# Patient Record
Sex: Male | Born: 2000 | Race: White | Hispanic: No | Marital: Single | Smoking: Never smoker
Health system: Southern US, Community
[De-identification: ages and names within clinical notes are randomized; demographics above are authoritative.]

## PROBLEM LIST (undated history)

## (undated) DIAGNOSIS — F419 Anxiety disorder, unspecified: Secondary | ICD-10-CM

---
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
## 2020-02-20 ENCOUNTER — Inpatient Hospital Stay (HOSPITAL_COMMUNITY)
Admission: EM | Admit: 2020-02-20 | Discharge: 2020-02-23 | DRG: 340 | Disposition: A | Discharge status: Home / Self Care | Payer: BC Managed Care – PPO | Attending: General Surgery | Admitting: General Surgery

## 2020-02-20 ENCOUNTER — Encounter (HOSPITAL_COMMUNITY): Payer: Self-pay | Admitting: Certified Registered Nurse Anesthetist

## 2020-02-20 ENCOUNTER — Other Ambulatory Visit: Payer: Self-pay

## 2020-02-20 ENCOUNTER — Encounter (HOSPITAL_COMMUNITY): Admission: EM | Disposition: A | Discharge status: Home / Self Care | Payer: Self-pay | Source: Home / Self Care

## 2020-02-20 ENCOUNTER — Emergency Department (HOSPITAL_COMMUNITY): Payer: BC Managed Care – PPO

## 2020-02-20 DIAGNOSIS — Z882 Allergy status to sulfonamides status: Secondary | ICD-10-CM

## 2020-02-20 DIAGNOSIS — Z20822 Contact with and (suspected) exposure to covid-19: Secondary | ICD-10-CM | POA: Diagnosis present

## 2020-02-20 DIAGNOSIS — K3532 Acute appendicitis with perforation and localized peritonitis, without abscess: Secondary | ICD-10-CM | POA: Diagnosis not present

## 2020-02-20 DIAGNOSIS — K3580 Unspecified acute appendicitis: Secondary | ICD-10-CM | POA: Diagnosis not present

## 2020-02-20 DIAGNOSIS — Z881 Allergy status to other antibiotic agents status: Secondary | ICD-10-CM

## 2020-02-20 HISTORY — PX: LAPAROSCOPIC APPENDECTOMY: SHX408

## 2020-02-20 LAB — COMPREHENSIVE METABOLIC PANEL
ALT: 23 U/L (ref 0–44)
AST: 30 U/L (ref 15–41)
Albumin: 4.2 g/dL (ref 3.5–5.0)
Alkaline Phosphatase: 51 U/L (ref 38–126)
Anion gap: 17 — ABNORMAL HIGH (ref 5–15)
BUN: 20 mg/dL (ref 6–20)
CO2: 22 mmol/L (ref 22–32)
Calcium: 9.4 mg/dL (ref 8.9–10.3)
Chloride: 97 mmol/L — ABNORMAL LOW (ref 98–111)
Creatinine, Ser: 1.19 mg/dL (ref 0.61–1.24)
GFR, Estimated: >60 mL/min (ref >60)
Glucose, Bld: 122 mg/dL — ABNORMAL HIGH (ref 70–99)
Potassium: 3.4 mmol/L — ABNORMAL LOW (ref 3.5–5.1)
Sodium: 136 mmol/L (ref 135–145)
Total Bilirubin: 2.3 mg/dL — ABNORMAL HIGH (ref 0.3–1.2)
Total Protein: 6.8 g/dL (ref 6.5–8.1)

## 2020-02-20 LAB — CBC
HCT: 48.9 % (ref 39.0–52.0)
Hemoglobin: 17.3 g/dL — ABNORMAL HIGH (ref 13.0–17.0)
MCH: 32.3 pg (ref 26.0–34.0)
MCHC: 35.4 g/dL (ref 30.0–36.0)
MCV: 91.4 fL (ref 80.0–100.0)
Platelets: 139 10*3/uL — ABNORMAL LOW (ref 150–400)
RBC: 5.35 MIL/uL (ref 4.22–5.81)
RDW: 11.9 % (ref 11.5–15.5)
WBC: 6.7 10*3/uL (ref 4.0–10.5)
nRBC: 0 % (ref 0.0–0.2)

## 2020-02-20 LAB — URINALYSIS, ROUTINE W REFLEX MICROSCOPIC
Bilirubin Urine: NEGATIVE
Glucose, UA: NEGATIVE mg/dL
Hgb urine dipstick: NEGATIVE
Ketones, ur: 80 mg/dL — AB
Leukocytes,Ua: NEGATIVE
Nitrite: NEGATIVE
Protein, ur: NEGATIVE mg/dL
Specific Gravity, Urine: >1.046 — ABNORMAL HIGH (ref 1.005–1.030)
pH: 7 (ref 5.0–8.0)

## 2020-02-20 LAB — SARS CORONAVIRUS 2 BY RT PCR (HOSPITAL ORDER, PERFORMED IN ~~LOC~~ HOSPITAL LAB): SARS Coronavirus 2: NEGATIVE

## 2020-02-20 LAB — LIPASE, BLOOD: Lipase: 20 U/L (ref 11–51)

## 2020-02-20 LAB — LACTIC ACID, PLASMA: Lactic Acid, Venous: 1.9 mmol/L (ref 0.5–1.9)

## 2020-02-20 SURGERY — APPENDECTOMY, LAPAROSCOPIC
Anesthesia: General | Site: Abdomen

## 2020-02-20 MED ORDER — FENTANYL CITRATE (PF) 250 MCG/5ML IJ SOLN
INTRAMUSCULAR | Status: AC
  Filled 2020-02-20: qty 5

## 2020-02-20 MED ORDER — MIDAZOLAM HCL 2 MG/2ML IJ SOLN
INTRAMUSCULAR | Status: AC
  Filled 2020-02-20: qty 2

## 2020-02-20 MED ORDER — LIDOCAINE 2% (20 MG/ML) 5 ML SYRINGE
INTRAMUSCULAR | Status: AC
  Filled 2020-02-20: qty 5

## 2020-02-20 MED ORDER — LACTATED RINGERS IV BOLUS
1000.0000 mL | Freq: Once | INTRAVENOUS | Status: AC
  Administered 2020-02-20: 1000 mL via INTRAVENOUS

## 2020-02-20 MED ORDER — IOHEXOL 300 MG/ML  SOLN
100.0000 mL | Freq: Once | INTRAMUSCULAR | Status: AC | PRN
  Administered 2020-02-20: 100 mL via INTRAVENOUS

## 2020-02-20 MED ORDER — ACETAMINOPHEN 325 MG PO TABS
650.0000 mg | ORAL_TABLET | Freq: Once | ORAL | Status: AC
  Administered 2020-02-20: 650 mg via ORAL
  Filled 2020-02-20: qty 2

## 2020-02-20 MED ORDER — PROPOFOL 10 MG/ML IV BOLUS
INTRAVENOUS | Status: AC
  Filled 2020-02-20: qty 20

## 2020-02-20 MED ORDER — KCL IN DEXTROSE-NACL 20-5-0.45 MEQ/L-%-% IV SOLN
INTRAVENOUS | Status: DC
  Filled 2020-02-20 (×4): qty 1000

## 2020-02-20 MED ORDER — MORPHINE SULFATE (PF) 4 MG/ML IV SOLN
4.0000 mg | Freq: Once | INTRAVENOUS | Status: AC
  Administered 2020-02-20: 4 mg via INTRAVENOUS
  Filled 2020-02-20: qty 1

## 2020-02-20 MED ORDER — SODIUM CHLORIDE 0.9 % IV SOLN
2.0000 g | INTRAVENOUS | Status: DC
  Administered 2020-02-20: 2 g via INTRAVENOUS
  Filled 2020-02-20: qty 20

## 2020-02-20 MED ORDER — METRONIDAZOLE IN NACL 5-0.79 MG/ML-% IV SOLN
500.0000 mg | Freq: Three times a day (TID) | INTRAVENOUS | Status: DC
  Administered 2020-02-20: 500 mg via INTRAVENOUS
  Filled 2020-02-20: qty 100

## 2020-02-20 SURGICAL SUPPLY — 70 items
ADH SKN CLS APL DERMABOND .7 (GAUZE/BANDAGES/DRESSINGS) ×1
APL PRP STRL LF DISP 70% ISPRP (MISCELLANEOUS) ×1
APL SWBSTK 6 STRL LF DISP (MISCELLANEOUS) ×1
APPLICATOR COTTON TIP 6 STRL (MISCELLANEOUS) IMPLANT
APPLICATOR COTTON TIP 6IN STRL (MISCELLANEOUS) ×2
APPLIER CLIP ROT 10 11.4 M/L (STAPLE)
APR CLP MED LRG 11.4X10 (STAPLE)
BAG SPEC RTRVL 10 TROC 200 (ENDOMECHANICALS) ×1
BLADE CLIPPER SURG (BLADE) IMPLANT
CANISTER SUCT 3000ML PPV (MISCELLANEOUS) ×3 IMPLANT
CHLORAPREP W/TINT 26 (MISCELLANEOUS) ×2 IMPLANT
CLIP APPLIE ROT 10 11.4 M/L (STAPLE) IMPLANT
CONT SPEC 4OZ CLIKSEAL STRL BL (MISCELLANEOUS) ×1 IMPLANT
COVER SURGICAL LIGHT HANDLE (MISCELLANEOUS) ×2 IMPLANT
COVER WAND RF STERILE (DRAPES) ×1 IMPLANT
CUTTER FLEX LINEAR 45M (STAPLE) ×2 IMPLANT
DERMABOND ADVANCED (GAUZE/BANDAGES/DRESSINGS) ×1
DERMABOND ADVANCED .7 DNX12 (GAUZE/BANDAGES/DRESSINGS) IMPLANT
DRSG TEGADERM 4X4.75 (GAUZE/BANDAGES/DRESSINGS) IMPLANT
ELECT REM PT RETURN 9FT ADLT (ELECTROSURGICAL) ×2
ELECTRODE REM PT RTRN 9FT ADLT (ELECTROSURGICAL) ×1 IMPLANT
ENDOLOOP SUT PDS II  0 18 (SUTURE)
ENDOLOOP SUT PDS II 0 18 (SUTURE) IMPLANT
GLOVE BIOGEL M STRL SZ7.5 (GLOVE) ×2 IMPLANT
GLOVE BIOGEL PI ORTHO PRO 7.5 (GLOVE) ×1
GLOVE BIOGEL PI ORTHO PRO SZ7 (GLOVE) ×1
GLOVE INDICATOR 8.0 STRL GRN (GLOVE) ×4 IMPLANT
GLOVE PI ORTHO PRO STRL 7.5 (GLOVE) IMPLANT
GLOVE PI ORTHO PRO STRL SZ7 (GLOVE) IMPLANT
GLOVE SRG 8 PF TXTR STRL LF DI (GLOVE) IMPLANT
GLOVE SURG SS PI 7.0 STRL IVOR (GLOVE) IMPLANT
GLOVE SURG UNDER POLY LF SZ7 (GLOVE) ×1 IMPLANT
GLOVE SURG UNDER POLY LF SZ8 (GLOVE) ×2
GOWN STRL REUS W/ TWL LRG LVL3 (GOWN DISPOSABLE) ×2 IMPLANT
GOWN STRL REUS W/TWL 2XL LVL3 (GOWN DISPOSABLE) ×2 IMPLANT
GOWN STRL REUS W/TWL LRG LVL3 (GOWN DISPOSABLE) ×2
GRASPER SUT TROCAR 14GX15 (MISCELLANEOUS) IMPLANT
KIT BASIN OR (CUSTOM PROCEDURE TRAY) ×2 IMPLANT
KIT TURNOVER KIT B (KITS) ×2 IMPLANT
NDL 18GX1X1/2 (RX/OR ONLY) (NEEDLE) IMPLANT
NEEDLE 18GX1X1/2 (RX/OR ONLY) (NEEDLE) ×2 IMPLANT
NS IRRIG 1000ML POUR BTL (IV SOLUTION) ×2 IMPLANT
PAD ARMBOARD 7.5X6 YLW CONV (MISCELLANEOUS) ×4 IMPLANT
POUCH RETRIEVAL ECOSAC 10 (ENDOMECHANICALS) IMPLANT
POUCH RETRIEVAL ECOSAC 10MM (ENDOMECHANICALS) ×2
RELOAD 45 VASCULAR/THIN (ENDOMECHANICALS) IMPLANT
RELOAD STAPLE 45 2.5 WHT GRN (ENDOMECHANICALS) IMPLANT
RELOAD STAPLE 45 3.5 BLU ETS (ENDOMECHANICALS) IMPLANT
RELOAD STAPLE 60 3.6 BLU REG (STAPLE) IMPLANT
RELOAD STAPLE TA45 3.5 REG BLU (ENDOMECHANICALS) IMPLANT
RELOAD STAPLER BLUE 60MM (STAPLE) ×1 IMPLANT
SCISSORS LAP 5X35 DISP (ENDOMECHANICALS) IMPLANT
SET IRRIG TUBING LAPAROSCOPIC (IRRIGATION / IRRIGATOR) ×2 IMPLANT
SET TUBE SMOKE EVAC HIGH FLOW (TUBING) ×2 IMPLANT
SHEARS HARMONIC ACE PLUS 36CM (ENDOMECHANICALS) ×2 IMPLANT
SLEEVE ENDOPATH XCEL 5M (ENDOMECHANICALS) ×2 IMPLANT
SPONGE GAUZE 2X2 8PLY STRL LF (GAUZE/BANDAGES/DRESSINGS) IMPLANT
STAPLE ECHEON FLEX 60 POW ENDO (STAPLE) ×1 IMPLANT
STAPLER RELOAD BLUE 60MM (STAPLE) ×2
STRIP CLOSURE SKIN 1/2X4 (GAUZE/BANDAGES/DRESSINGS) IMPLANT
SUT MNCRL AB 4-0 PS2 18 (SUTURE) ×2 IMPLANT
SUT VICRYL 0 UR6 27IN ABS (SUTURE) IMPLANT
TOWEL GREEN STERILE (TOWEL DISPOSABLE) ×2 IMPLANT
TOWEL GREEN STERILE FF (TOWEL DISPOSABLE) ×2 IMPLANT
TRAY FOLEY W/BAG SLVR 16FR (SET/KITS/TRAYS/PACK)
TRAY FOLEY W/BAG SLVR 16FR ST (SET/KITS/TRAYS/PACK) IMPLANT
TRAY LAPAROSCOPIC MC (CUSTOM PROCEDURE TRAY) ×2 IMPLANT
TROCAR XCEL BLUNT TIP 100MML (ENDOMECHANICALS) ×2 IMPLANT
TROCAR XCEL NON-BLD 5MMX100MML (ENDOMECHANICALS) ×2 IMPLANT
WATER STERILE IRR 1000ML POUR (IV SOLUTION) ×2 IMPLANT

## 2020-02-20 NOTE — ED Notes (Signed)
Pt ambulatory to [facility]

## 2020-02-20 NOTE — ED Triage Notes (Signed)
Pt from home for eval of intense pain in lower abdomen and groin, beginning below his umbilicus and radiating towards scrotum. Has been vomiting almost every hour since Tuesday, but pain didn't start until today about two hours PTA.

## 2020-02-20 NOTE — H&P (Signed)
CC: abdominal pain  Requesting provider: Dr Silverio Lay  HPI: Clinton Woods is an 20 y.o. male who is here for evaluation because of nausea, vomiting, and abdominal pain.  He also had fever as well.  He started having nausea and vomiting on Tuesday in the mid day.  He states that he vomited pretty much on the hour every hour from Tuesday to Wednesday.  He then developed abdominal discomfort.  This was mainly around his umbilicus and is now shifted to his right abdomen.  He also had fever.  He also did not have much of an appetite.  He had a protein bar and a banana this morning.  Last bowel movement was today.  No prior surgery.  No prior symptoms.  No trouble urinating.  No blood in stool.  No hematemesis.  Goes to [facility]  No tobacco or drug use or alcohol use  Past Medical History:  Diagnosis Date  . Anxiety     No past surgical history on file.  No family history on file.  Social:  reports that he has never smoked. He has never used smokeless tobacco. He reports that he does not drink alcohol and does not use drugs.  Allergies:  Allergies  Allergen Reactions  . Doxycycline Hyclate Other (See Comments)    abdominal pain  . Sulfamethoxazole-Trimethoprim Rash    Medications: I have reviewed the patient's current medications.   ROS - all of the below systems have been reviewed with the patient and positives are indicated with bold text General: chills, fever or night sweats Eyes: blurry vision or double vision ENT: epistaxis or sore throat Allergy/Immunology: itchy/watery eyes or nasal congestion Hematologic/Lymphatic: bleeding problems, blood clots or swollen lymph nodes Endocrine: temperature intolerance or unexpected weight changes Breast: new or changing breast lumps or nipple discharge Resp: cough, shortness of breath, or wheezing CV: chest pain or dyspnea on exertion GI: as per HPI GU: dysuria, trouble voiding, or hematuria MSK: joint pain or joint stiffness Neuro: TIA  or stroke symptoms Derm: pruritus and skin lesion changes Psych: anxiety and depression  PE Blood pressure (!) 121/51, pulse 91, temperature (!) 102.5 F (39.2 C), temperature source Oral, resp. rate 18, SpO2 99 %. Constitutional: NAD; conversant; no deformities Eyes: Moist conjunctiva; no lid lag; anicteric; PERRL Neck: Trachea midline; no thyromegaly Lungs: Normal respiratory effort; no tactile fremitus CV: RRR; no palpable thrills; no pitting edema GI: Abd flat, soft, TTP right abd, +voluntary guarding; no palpable hepatosplenomegaly MSK: Normal gait; no clubbing/cyanosis Psychiatric: Appropriate affect; alert and oriented x3 Lymphatic: No palpable cervical or axillary lymphadenopathy Skin:no rash/lesion/edema  Results for orders placed or performed during the hospital encounter of 02/20/20 (from the past 48 hour(s))  Lipase, blood     Status: None   Collection Time: 02/20/20  5:19 PM  Result Value Ref Range   Lipase 20 11 - 51 U/L    Comment: Performed at [facility], [address] [place], [place] [address]  Comprehensive metabolic panel     Status: Abnormal   Collection Time: 02/20/20  5:19 PM  Result Value Ref Range   Sodium 136 135 - 145 mmol/L   Potassium 3.4 (L) 3.5 - 5.1 mmol/L   Chloride 97 (L) 98 - 111 mmol/L   CO2 22 22 - 32 mmol/L   Glucose, Bld 122 (H) 70 - 99 mg/dL    Comment: Glucose reference range applies only to samples taken after fasting for at least 8 hours.  BUN 20 6 - 20 mg/dL   Creatinine, Ser 4.88 0.61 - 1.24 mg/dL   Calcium 9.4 8.9 - 89.1 mg/dL   Total Protein 6.8 6.5 - 8.1 g/dL   Albumin 4.2 3.5 - 5.0 g/dL   AST 30 15 - 41 U/L   ALT 23 0 - 44 U/L   Alkaline Phosphatase 51 38 - 126 U/L   Total Bilirubin 2.3 (H) 0.3 - 1.2 mg/dL   GFR, Estimated >69 >45 mL/min    Comment: (NOTE) Calculated using the CKD-EPI Creatinine Equation (2021)    Anion gap 17 (H) 5 - 15    Comment: Performed at [facility], [address],  [place], [place] [address]  CBC     Status: Abnormal   Collection Time: 02/20/20  5:19 PM  Result Value Ref Range   WBC 6.7 4.0 - 10.5 K/uL   RBC 5.35 4.22 - 5.81 MIL/uL   Hemoglobin 17.3 (H) 13.0 - 17.0 g/dL   HCT 28.0 03.4 - 91.7 %   MCV 91.4 80.0 - 100.0 fL   MCH 32.3 26.0 - 34.0 pg   MCHC 35.4 30.0 - 36.0 g/dL   RDW 91.5 05.6 - 97.9 %   Platelets 139 (L) 150 - 400 K/uL    Comment: REPEATED TO VERIFY   nRBC 0.0 0.0 - 0.2 %    Comment: Performed at [facility], [address] [place], [place] [address]  SARS Coronavirus 2 by RT PCR (hospital order, performed in [facility] hospital lab) Nasopharyngeal Nasopharyngeal Swab     Status: None   Collection Time: 02/20/20  7:36 PM   Specimen: Nasopharyngeal Swab  Result Value Ref Range   SARS Coronavirus 2 NEGATIVE NEGATIVE    Comment: (NOTE) SARS-CoV-2 target nucleic acids are NOT DETECTED.  The SARS-CoV-2 RNA is generally detectable in upper and lower respiratory specimens during the acute phase of infection. The lowest concentration of SARS-CoV-2 viral copies this assay can detect is 250 copies / mL. A negative result does not preclude SARS-CoV-2 infection and should not be used as the sole basis for treatment or other patient management decisions.  A negative result may occur with improper specimen collection / handling, submission of specimen other than nasopharyngeal swab, presence of viral mutation(s) within the areas targeted by this assay, and inadequate number of viral copies (<250 copies / mL). A negative result must be combined with clinical observations, patient history, and epidemiological information.  Fact Sheet for Patients:   BoilerBrush.com.cy  Fact Sheet for Healthcare Providers: https://pope.com/  This test is not yet approved or  cleared by the [place] FDA and has been authorized for detection and/or diagnosis of SARS-CoV-2 by FDA under an Emergency  Use Authorization (EUA).  This EUA will remain in effect (meaning this test can be used) for the duration of the COVID-19 declaration under Section 564(b)(1) of the Act, 21 U.S.C. section 360bbb-3(b)(1), unless the authorization is terminated or revoked sooner.  Performed at [facility], [address], [place], [place] [address]   Urinalysis, Routine w reflex microscopic Urine, Clean Catch     Status: Abnormal   Collection Time: 02/20/20  9:17 PM  Result Value Ref Range   Color, Urine YELLOW YELLOW   APPearance CLEAR CLEAR   Specific Gravity, Urine >1.046 (H) 1.005 - 1.030   pH 7.0 5.0 - 8.0   Glucose, UA NEGATIVE NEGATIVE mg/dL   Hgb urine dipstick NEGATIVE NEGATIVE   Bilirubin Urine NEGATIVE  NEGATIVE   Ketones, ur 80 (A) NEGATIVE mg/dL   Protein, ur NEGATIVE NEGATIVE mg/dL   Nitrite NEGATIVE NEGATIVE   Leukocytes,Ua NEGATIVE NEGATIVE    Comment: Performed at [facility], [address], [place], [place] [address]  Lactic acid, plasma     Status: None   Collection Time: 02/20/20  9:23 PM  Result Value Ref Range   Lactic Acid, Venous 1.9 0.5 - 1.9 mmol/L    Comment: Performed at [facility], [address], [place], [place] [address]    CT ABDOMEN PELVIS W CONTRAST  Result Date: 02/20/2020 CLINICAL DATA:  Abdominal pain for 2 days, fever EXAM: CT ABDOMEN AND PELVIS WITH CONTRAST TECHNIQUE: Multidetector CT imaging of the abdomen and pelvis was performed using the standard protocol following bolus administration of intravenous contrast. CONTRAST:  OMNIPAQUE IOHEXOL 300 MG/ML  SOLN COMPARISON:  None. FINDINGS: Lower chest: No acute pleural or parenchymal lung disease. Hepatobiliary: No focal liver abnormality is seen. No gallstones, gallbladder wall thickening, or biliary dilatation. Pancreas: Unremarkable. No pancreatic ductal dilatation or surrounding inflammatory changes. Spleen: Normal in size without focal abnormality. Adrenals/Urinary Tract: Adrenal  glands are unremarkable. Kidneys are normal, without renal calculi, focal lesion, or hydronephrosis. Bladder is unremarkable. Stomach/Bowel: Evaluation of the bowel is limited without oral contrast. No bowel obstruction or ileus. I believe the appendix can be seen in the right lower quadrant adjacent to the iliac vessels, with evidence of distal wall thickening, hyperenhancement, and dilation measuring 11 mm reference image 72/3. No free gas to suggest perforation. No evidence of abscess. Vascular/Lymphatic: No significant vascular findings are present. No enlarged abdominal or pelvic lymph nodes. Reproductive: Prostate is unremarkable. Other: Small amount of free fluid within the rectovesical space. No free intraperitoneal gas. No abdominal wall hernia. Musculoskeletal: No acute or destructive bony lesions. Reconstructed images demonstrate no additional findings. IMPRESSION: 1. Dilated inflamed appendix in the right lower quadrant, compatible with acute uncomplicated appendicitis. There is free fluid in the pelvis, but no evidence of perforation, loculated fluid, or abscess. 2. No other intra-abdominal or intrapelvic findings on this examination limited by the lack of oral contrast. Electronically Signed   By: Sharlet Salina M.D.   On: 02/20/2020 20:27    Imaging: Personally reviewed  A/P: HAMZA EMPSON is an 20 y.o. male with  Acute appendicitis N/v/abd pain/fever (due to above) Hypokalemia likely secondary to emesis Elevated T bili Elevated Hct  Likely some mild dehydration from vomiting causing hypokalemia and elevated tbili  We discussed the etiology and management of acute appendicitis. We discussed operative and nonoperative management.  I recommended operative management along with IV antibiotics.  We discussed laparoscopic appendectomy. We discussed the risk and benefits of surgery including but not limited to bleeding, infection, injury to surrounding structures, need to convert to an  open procedure, blood clot formation, post operative abscess or wound infection, staple line complications such as leak or bleeding, hernia formation, post operative ileus, need for additional procedures, anesthesia complications, and the typical postoperative course. I explained that the patient should expect a good improvement in their symptoms. His mom was at bedside.   IV abx IVF Replace potassium Repeat labs in am to follow Wendie Agreste. Andrey Campanile, MD, FACS General, Bariatric, & Minimally Invasive Surgery [facility], [place]

## 2020-02-20 NOTE — ED Provider Notes (Signed)
[facility] EMERGENCY DEPARTMENT Provider Note   CSN: [id number] Arrival date & time: 02/20/20  1656     History Chief Complaint  Patient presents with  . Abdominal Pain    Clinton Woods is a 20 y.o. male.  The history is provided by the patient.  Abdominal Pain Pain location:  Periumbilical Pain quality: aching and cramping   Pain radiates to:  Suprapubic region and epigastric region Pain severity:  Severe Onset quality:  Gradual Timing:  Constant Progression:  Unchanged Chronicity:  New Context: diet changes   Context: not suspicious food intake   Relieved by:  Nothing Worsened by:  Movement Ineffective treatments:  None tried Associated symptoms: anorexia and nausea   Associated symptoms: no chest pain, no chills, no cough, no dysuria, no fever, no hematuria, no shortness of breath, no sore throat and no vomiting        Past Medical History:  Diagnosis Date  . Anxiety     Patient Active Problem List   Diagnosis Date Noted  . 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 It band syndrome, right 10/19/2017  . 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     No family history on file.  Social History   Tobacco Use  . Smoking status: Never Smoker  . Smokeless tobacco: Never Used  Vaping Use  . Vaping Use: Never used  Substance Use Topics  . Alcohol use: No  . Drug use: No    Home Medications Prior to Admission medications   Medication Sig Start Date End Date Taking? Authorizing Provider  ARIPiprazole (ABILIFY) 10 MG tablet Take 1 tablet (10 mg total) by mouth at bedtime. Patient not taking: No sig reported 02/23/17   [name], MD  lamoTRIgine (LAMICTAL) 100 MG tablet Take 1 tablet (100 mg total) by mouth daily. Patient not taking: No sig reported 02/24/17   [name], MD    Allergies    Doxycycline hyclate and  Sulfamethoxazole-trimethoprim  Review of Systems   Review of Systems  Unable to perform ROS: Acuity of condition  Constitutional: Negative for chills and fever.  HENT: Negative for ear pain and sore throat.   Eyes: Negative for pain and visual disturbance.  Respiratory: Negative for cough and shortness of breath.   Cardiovascular: Negative for chest pain and palpitations.  Gastrointestinal: Positive for abdominal pain, anorexia and nausea. Negative for vomiting.  Genitourinary: Negative for dysuria and hematuria.  Musculoskeletal: Negative for arthralgias and back pain.  Skin: Negative for color change and rash.  Neurological: Negative for seizures and syncope.  All other systems reviewed and are negative.   Physical Exam Updated Vital Signs BP 127/62   Pulse 95   Temp (!) 102.9 F (39.4 C) (Oral)   Resp (!) 21   SpO2 100%   Physical Exam Vitals and nursing note reviewed.  Constitutional:      Appearance: He is well-developed and well-nourished.  HENT:     Head: Normocephalic and atraumatic.  Eyes:     Conjunctiva/sclera: Conjunctivae normal.  Cardiovascular:     Rate and Rhythm: Normal rate and regular rhythm.     Heart sounds: No murmur heard.   Pulmonary:     Effort: Pulmonary effort is normal. No respiratory distress.     Breath sounds: Normal breath sounds.  Abdominal:     Palpations: Abdomen is soft.     Tenderness: There is abdominal tenderness in  the periumbilical area and suprapubic area. There is guarding. Positive signs include Rovsing's sign.  Musculoskeletal:        General: No edema.     Cervical back: Neck supple.  Skin:    General: Skin is warm and dry.  Neurological:     Mental Status: He is alert.  Psychiatric:        Mood and Affect: Mood and affect normal.     ED Results / Procedures / Treatments   Labs (all labs ordered are listed, but only abnormal results are displayed) Labs Reviewed  COMPREHENSIVE METABOLIC PANEL - Abnormal;  Notable for the following components:      Result Value   Potassium 3.4 (*)    Chloride 97 (*)    Glucose, Bld 122 (*)    Total Bilirubin 2.3 (*)    Anion gap 17 (*)    All other components within normal limits  CBC - Abnormal; Notable for the following components:   Hemoglobin 17.3 (*)    Platelets 139 (*)    All other components within normal limits  SARS CORONAVIRUS 2 BY RT PCR (HOSPITAL ORDER, PERFORMED IN [facility])  LIPASE, BLOOD  URINALYSIS, ROUTINE W REFLEX MICROSCOPIC  LACTIC ACID, PLASMA  LACTIC ACID, PLASMA    EKG None  Radiology CT ABDOMEN PELVIS W CONTRAST  Result Date: 02/20/2020 CLINICAL DATA:  Abdominal pain for 2 days, fever EXAM: CT ABDOMEN AND PELVIS WITH CONTRAST TECHNIQUE: Multidetector CT imaging of the abdomen and pelvis was performed using the standard protocol following bolus administration of intravenous contrast. CONTRAST:  OMNIPAQUE IOHEXOL 300 MG/ML  SOLN COMPARISON:  None. FINDINGS: Lower chest: No acute pleural or parenchymal lung disease. Hepatobiliary: No focal liver abnormality is seen. No gallstones, gallbladder wall thickening, or biliary dilatation. Pancreas: Unremarkable. No pancreatic ductal dilatation or surrounding inflammatory changes. Spleen: Normal in size without focal abnormality. Adrenals/Urinary Tract: Adrenal glands are unremarkable. Kidneys are normal, without renal calculi, focal lesion, or hydronephrosis. Bladder is unremarkable. Stomach/Bowel: Evaluation of the bowel is limited without oral contrast. No bowel obstruction or ileus. I believe the appendix can be seen in the right lower quadrant adjacent to the iliac vessels, with evidence of distal wall thickening, hyperenhancement, and dilation measuring 11 mm reference image 72/3. No free gas to suggest perforation. No evidence of abscess. Vascular/Lymphatic: No significant vascular findings are present. No enlarged abdominal or pelvic lymph nodes. Reproductive:  Prostate is unremarkable. Other: Small amount of free fluid within the rectovesical space. No free intraperitoneal gas. No abdominal wall hernia. Musculoskeletal: No acute or destructive bony lesions. Reconstructed images demonstrate no additional findings. IMPRESSION: 1. Dilated inflamed appendix in the right lower quadrant, compatible with acute uncomplicated appendicitis. There is free fluid in the pelvis, but no evidence of perforation, loculated fluid, or abscess. 2. No other intra-abdominal or intrapelvic findings on this examination limited by the lack of oral contrast. Electronically Signed   By: Sharlet Salina M.D.   On: 02/20/2020 20:27    Procedures Procedures   Medications Ordered in ED Medications  cefTRIAXone (ROCEPHIN) 2 g in sodium chloride 0.9 % 100 mL IVPB (2 g Intravenous New Bag/Given 02/20/20 2123)  metroNIDAZOLE (FLAGYL) IVPB 500 mg (500 mg Intravenous New Bag/Given 02/20/20 2122)  lactated ringers bolus 1,000 mL (0 mLs Intravenous Stopped 02/20/20 2103)  morphine 4 MG/ML injection 4 mg (4 mg Intravenous Given 02/20/20 1928)  iohexol (OMNIPAQUE) 300 MG/ML solution 100 mL (100 mLs Intravenous Contrast Given 02/20/20 2020)  ED Course  I have reviewed the triage vital signs and the nursing notes.  Pertinent labs & imaging results that were available during my care of the patient were reviewed by me and considered in my medical decision making (see chart for details).    MDM Rules/Calculators/A&P                          This is a 20 year old male with no reported past medical history presents emergency department for lower abdominal pain that started around 1530 today.  Patient reports that on Tuesday night he had brief episodes of vomiting that resolved on Wednesday morning he felt that he was doing well until today around 1530 he developed significant lower abdominal pain that was around the umbilicus.  He also endorses some anorexia with decreased appetite and some nausea  but no vomiting today.  He states the pain starts around his bellybutton and radiates into his groin as well as up into the epigastric area.  On exam he is febrile to 102.9, hemodynamically stable, in mild pain distress.  He has significant abdominal tenderness, mostly over the umbilicus with associated guarding.  Testicular exam is normal with no testicular tenderness or swelling.  Patient does have anion gap and normal bicarb, hemoglobin of 17.3 which is likely hemoconcentration as the patient reports he has not been able to eat or drink today because he has not had any appetite.  Concern for appendicitis, CT was obtained that showed dilated inflamed appendix in the right lower quadrant compatible with acute uncomplicated appendicitis with free fluid in the pelvis but no evidence of perforation, loculated fluid, or abscess.  I did speak with Dr. Andrey Campanile, general surgery, who will plan to admit to evaluate the patient and discuss surgical intervention.  Patient was given morphine in the emergency department with significant improvement in his pain. Final Clinical Impression(s) / ED Diagnoses Final diagnoses:  Acute appendicitis, unspecified acute appendicitis type    Rx / DC Orders ED Discharge Orders    None       Kathleen Lime, MD 02/20/20 2126    Charlynne Pander, MD 02/24/20 1030

## 2020-02-21 ENCOUNTER — Emergency Department (HOSPITAL_COMMUNITY): Payer: BC Managed Care – PPO | Admitting: Certified Registered Nurse Anesthetist

## 2020-02-21 ENCOUNTER — Encounter (HOSPITAL_COMMUNITY): Payer: Self-pay | Admitting: General Surgery

## 2020-02-21 DIAGNOSIS — K3580 Unspecified acute appendicitis: Secondary | ICD-10-CM | POA: Diagnosis present

## 2020-02-21 DIAGNOSIS — K3532 Acute appendicitis with perforation and localized peritonitis, without abscess: Secondary | ICD-10-CM | POA: Diagnosis present

## 2020-02-21 DIAGNOSIS — Z20822 Contact with and (suspected) exposure to covid-19: Secondary | ICD-10-CM | POA: Diagnosis present

## 2020-02-21 DIAGNOSIS — Z882 Allergy status to sulfonamides status: Secondary | ICD-10-CM | POA: Diagnosis not present

## 2020-02-21 DIAGNOSIS — Z881 Allergy status to other antibiotic agents status: Secondary | ICD-10-CM | POA: Diagnosis not present

## 2020-02-21 LAB — CBC
HCT: 42.6 % (ref 39.0–52.0)
Hemoglobin: 14.8 g/dL (ref 13.0–17.0)
MCH: 32.3 pg (ref 26.0–34.0)
MCHC: 34.7 g/dL (ref 30.0–36.0)
MCV: 93 fL (ref 80.0–100.0)
Platelets: 100 10*3/uL — ABNORMAL LOW (ref 150–400)
RBC: 4.58 MIL/uL (ref 4.22–5.81)
RDW: 11.9 % (ref 11.5–15.5)
WBC: 8.4 10*3/uL (ref 4.0–10.5)
nRBC: 0 % (ref 0.0–0.2)

## 2020-02-21 LAB — COMPREHENSIVE METABOLIC PANEL
ALT: 17 U/L (ref 0–44)
AST: 20 U/L (ref 15–41)
Albumin: 3.1 g/dL — ABNORMAL LOW (ref 3.5–5.0)
Alkaline Phosphatase: 42 U/L (ref 38–126)
Anion gap: 9 (ref 5–15)
BUN: 20 mg/dL (ref 6–20)
CO2: 26 mmol/L (ref 22–32)
Calcium: 8.5 mg/dL — ABNORMAL LOW (ref 8.9–10.3)
Chloride: 100 mmol/L (ref 98–111)
Creatinine, Ser: 1.12 mg/dL (ref 0.61–1.24)
GFR, Estimated: >60 mL/min (ref >60)
Glucose, Bld: 158 mg/dL — ABNORMAL HIGH (ref 70–99)
Potassium: 4.6 mmol/L (ref 3.5–5.1)
Sodium: 135 mmol/L (ref 135–145)
Total Bilirubin: 1.7 mg/dL — ABNORMAL HIGH (ref 0.3–1.2)
Total Protein: 5.4 g/dL — ABNORMAL LOW (ref 6.5–8.1)

## 2020-02-21 LAB — MAGNESIUM: Magnesium: 1.8 mg/dL (ref 1.7–2.4)

## 2020-02-21 LAB — HIV ANTIBODY (ROUTINE TESTING W REFLEX): HIV Screen 4th Generation wRfx: NONREACTIVE

## 2020-02-21 MED ORDER — SODIUM CHLORIDE 0.9 % IR SOLN
Status: DC | PRN
  Administered 2020-02-20: 1000 mL

## 2020-02-21 MED ORDER — KETOROLAC TROMETHAMINE 30 MG/ML IJ SOLN
INTRAMUSCULAR | Status: DC | PRN
  Administered 2020-02-21: 30 mg via INTRAVENOUS

## 2020-02-21 MED ORDER — BUPIVACAINE-EPINEPHRINE (PF) 0.25% -1:200000 IJ SOLN
INTRAMUSCULAR | Status: AC
  Filled 2020-02-21: qty 30

## 2020-02-21 MED ORDER — FENTANYL CITRATE (PF) 100 MCG/2ML IJ SOLN
INTRAMUSCULAR | Status: DC | PRN
  Administered 2020-02-21: 100 ug via INTRAVENOUS
  Administered 2020-02-21: 25 ug via INTRAVENOUS
  Administered 2020-02-21: 50 ug via INTRAVENOUS
  Administered 2020-02-21: 25 ug via INTRAVENOUS

## 2020-02-21 MED ORDER — BUPIVACAINE HCL (PF) 0.25 % IJ SOLN
INTRAMUSCULAR | Status: AC
  Filled 2020-02-21: qty 30

## 2020-02-21 MED ORDER — LACTATED RINGERS IV SOLN: INTRAVENOUS | Status: DC | PRN

## 2020-02-21 MED ORDER — MORPHINE SULFATE (PF) 2 MG/ML IV SOLN: 1.0000 mg | INTRAVENOUS | Status: DC | PRN

## 2020-02-21 MED ORDER — PANTOPRAZOLE SODIUM 40 MG IV SOLR
40.0000 mg | Freq: Every day | INTRAVENOUS | Status: DC
  Administered 2020-02-21 – 2020-02-22 (×2): 40 mg via INTRAVENOUS
  Filled 2020-02-21 (×2): qty 40

## 2020-02-21 MED ORDER — PIPERACILLIN-TAZOBACTAM 3.375 G IVPB
3.3750 g | Freq: Three times a day (TID) | INTRAVENOUS | Status: DC
  Administered 2020-02-21 – 2020-02-23 (×7): 3.375 g via INTRAVENOUS
  Filled 2020-02-21 (×7): qty 50

## 2020-02-21 MED ORDER — PROPOFOL 10 MG/ML IV BOLUS
INTRAVENOUS | Status: DC | PRN
  Administered 2020-02-21: 200 mg via INTRAVENOUS

## 2020-02-21 MED ORDER — LIDOCAINE HCL (CARDIAC) PF 100 MG/5ML IV SOSY
PREFILLED_SYRINGE | INTRAVENOUS | Status: DC | PRN
  Administered 2020-02-21: 40 mg via INTRAVENOUS

## 2020-02-21 MED ORDER — OXYCODONE HCL 5 MG PO TABS
5.0000 mg | ORAL_TABLET | ORAL | Status: DC | PRN
Start: 2020-02-21 — End: 2020-02-23

## 2020-02-21 MED ORDER — ACETAMINOPHEN 500 MG PO TABS
1000.0000 mg | ORAL_TABLET | Freq: Three times a day (TID) | ORAL | Status: DC
  Administered 2020-02-21 – 2020-02-23 (×7): 1000 mg via ORAL
  Filled 2020-02-21 (×7): qty 2

## 2020-02-21 MED ORDER — ENOXAPARIN SODIUM 40 MG/0.4ML ~~LOC~~ SOLN
40.0000 mg | SUBCUTANEOUS | Status: DC
  Administered 2020-02-21 – 2020-02-22 (×2): 40 mg via SUBCUTANEOUS
  Filled 2020-02-21 (×2): qty 0.4

## 2020-02-21 MED ORDER — DEXAMETHASONE SODIUM PHOSPHATE 10 MG/ML IJ SOLN
INTRAMUSCULAR | Status: DC | PRN
  Administered 2020-02-21: 5 mg via INTRAVENOUS

## 2020-02-21 MED ORDER — ONDANSETRON HCL 4 MG/2ML IJ SOLN: 4.0000 mg | Freq: Four times a day (QID) | INTRAMUSCULAR | Status: DC | PRN

## 2020-02-21 MED ORDER — SUCCINYLCHOLINE CHLORIDE 20 MG/ML IJ SOLN
INTRAMUSCULAR | Status: DC | PRN
  Administered 2020-02-21: 120 mg via INTRAVENOUS

## 2020-02-21 MED ORDER — KETOROLAC TROMETHAMINE 30 MG/ML IJ SOLN
INTRAMUSCULAR | Status: AC
  Filled 2020-02-21: qty 1

## 2020-02-21 MED ORDER — SUCCINYLCHOLINE CHLORIDE 200 MG/10ML IV SOSY
PREFILLED_SYRINGE | INTRAVENOUS | Status: AC
  Filled 2020-02-21: qty 10

## 2020-02-21 MED ORDER — PHENYLEPHRINE 40 MCG/ML (10ML) SYRINGE FOR IV PUSH (FOR BLOOD PRESSURE SUPPORT)
PREFILLED_SYRINGE | INTRAVENOUS | Status: AC
  Filled 2020-02-21: qty 10

## 2020-02-21 MED ORDER — ONDANSETRON HCL 4 MG/2ML IJ SOLN
INTRAMUSCULAR | Status: AC
  Filled 2020-02-21: qty 2

## 2020-02-21 MED ORDER — ONDANSETRON HCL 4 MG/2ML IJ SOLN
INTRAMUSCULAR | Status: DC | PRN
  Administered 2020-02-21: 4 mg via INTRAVENOUS

## 2020-02-21 MED ORDER — FENTANYL CITRATE (PF) 100 MCG/2ML IJ SOLN: 25.0000 ug | INTRAMUSCULAR | Status: DC | PRN

## 2020-02-21 MED ORDER — OXYCODONE HCL 5 MG/5ML PO SOLN: 5.0000 mg | Freq: Once | ORAL | Status: DC | PRN

## 2020-02-21 MED ORDER — DIPHENHYDRAMINE HCL 50 MG/ML IJ SOLN: 12.5000 mg | Freq: Four times a day (QID) | INTRAMUSCULAR | Status: DC | PRN

## 2020-02-21 MED ORDER — PHENYLEPHRINE HCL (PRESSORS) 10 MG/ML IV SOLN
INTRAVENOUS | Status: DC | PRN
  Administered 2020-02-21: 80 ug via INTRAVENOUS

## 2020-02-21 MED ORDER — 0.9 % SODIUM CHLORIDE (POUR BTL) OPTIME
TOPICAL | Status: DC | PRN
  Administered 2020-02-20: 1000 mL

## 2020-02-21 MED ORDER — DOCUSATE SODIUM 100 MG PO CAPS
100.0000 mg | ORAL_CAPSULE | Freq: Two times a day (BID) | ORAL | Status: DC
  Administered 2020-02-21 – 2020-02-22 (×3): 100 mg via ORAL
  Filled 2020-02-21 (×4): qty 1

## 2020-02-21 MED ORDER — OXYCODONE HCL 5 MG PO TABS: 5.0000 mg | ORAL_TABLET | Freq: Once | ORAL | Status: DC | PRN

## 2020-02-21 MED ORDER — ROCURONIUM BROMIDE 10 MG/ML (PF) SYRINGE
PREFILLED_SYRINGE | INTRAVENOUS | Status: AC
  Filled 2020-02-21: qty 10

## 2020-02-21 MED ORDER — KETOROLAC TROMETHAMINE 30 MG/ML IJ SOLN
30.0000 mg | Freq: Three times a day (TID) | INTRAMUSCULAR | Status: AC
  Administered 2020-02-21 – 2020-02-22 (×6): 30 mg via INTRAVENOUS
  Filled 2020-02-21 (×6): qty 1

## 2020-02-21 MED ORDER — ROCURONIUM BROMIDE 100 MG/10ML IV SOLN
INTRAVENOUS | Status: DC | PRN
  Administered 2020-02-21: 40 mg via INTRAVENOUS
  Administered 2020-02-21: 10 mg via INTRAVENOUS

## 2020-02-21 MED ORDER — SIMETHICONE 80 MG PO CHEW
40.0000 mg | CHEWABLE_TABLET | Freq: Four times a day (QID) | ORAL | Status: DC | PRN
Start: 2020-02-21 — End: 2020-02-23

## 2020-02-21 MED ORDER — BUPIVACAINE-EPINEPHRINE 0.25% -1:200000 IJ SOLN
INTRAMUSCULAR | Status: DC | PRN
  Administered 2020-02-21 (×3): 10 mL

## 2020-02-21 MED ORDER — DEXMEDETOMIDINE (PRECEDEX) IN NS 20 MCG/5ML (4 MCG/ML) IV SYRINGE
PREFILLED_SYRINGE | INTRAVENOUS | Status: DC | PRN
  Administered 2020-02-21: 8 ug via INTRAVENOUS
  Administered 2020-02-21: 12 ug via INTRAVENOUS

## 2020-02-21 MED ORDER — DEXAMETHASONE SODIUM PHOSPHATE 10 MG/ML IJ SOLN
INTRAMUSCULAR | Status: AC
  Filled 2020-02-21: qty 1

## 2020-02-21 MED ORDER — SUGAMMADEX SODIUM 200 MG/2ML IV SOLN
INTRAVENOUS | Status: DC | PRN
  Administered 2020-02-21: 200 mg via INTRAVENOUS

## 2020-02-21 MED ORDER — ONDANSETRON 4 MG PO TBDP: 4.0000 mg | ORAL_TABLET | Freq: Four times a day (QID) | ORAL | Status: DC | PRN

## 2020-02-21 MED ORDER — MIDAZOLAM HCL 5 MG/5ML IJ SOLN
INTRAMUSCULAR | Status: DC | PRN
  Administered 2020-02-21: 2 mg via INTRAVENOUS

## 2020-02-21 MED ORDER — SODIUM CHLORIDE 0.9 % IR SOLN
Status: DC | PRN
  Administered 2020-02-21: 3000 mL

## 2020-02-21 MED ORDER — DIPHENHYDRAMINE HCL 12.5 MG/5ML PO ELIX: 12.5000 mg | ORAL_SOLUTION | Freq: Four times a day (QID) | ORAL | Status: DC | PRN

## 2020-02-21 MED ORDER — STERILE WATER FOR IRRIGATION IR SOLN
Status: DC | PRN
  Administered 2020-02-20: 1000 mL

## 2020-02-21 MED ORDER — LIDOCAINE-EPINEPHRINE 1 %-1:100000 IJ SOLN
INTRAMUSCULAR | Status: AC
  Filled 2020-02-21: qty 1

## 2020-02-21 NOTE — Transfer of Care (Signed)
Immediate Anesthesia Transfer of Care Note  Patient: Clinton Woods  Procedure(s) Performed: APPENDECTOMY LAPAROSCOPIC (N/A )  Patient Location: PACU  Anesthesia Type:General  Level of Consciousness: drowsy  Airway & Oxygen Therapy: Patient Spontanous Breathing and Patient connected to nasal cannula oxygen  Post-op Assessment: Report given to RN, Post -op Vital signs reviewed and stable and Patient moving all extremities  Post vital signs: Reviewed and stable  Last Vitals:  Vitals Value Taken Time  BP 120/65 02/21/20 0215  Temp 37.3 C 02/21/20 0215  Pulse 102 02/21/20 0217  Resp 18 02/21/20 0217  SpO2 98 % 02/21/20 0217  Vitals shown include unvalidated device data.  Last Pain:  Vitals:   02/21/20 0000  TempSrc:   PainSc: 5          Complications: No complications documented.

## 2020-02-21 NOTE — Op Note (Signed)
AJAX SCHROLL [id number] 2000/06/01 02/21/2020  Appendectomy, Lap, Procedure Note  Indications: The patient presented with a history of right-sided abdominal pain. A CT revealed findings consistent with acute appendicitis.  There was fluid in the pelvis but no frank signs of perforation on CT.  However since the patient's symptoms started on Tuesday I was concerned for potential perforation so therefore I recommended proceeding to the operating room tonight as opposed to in the morning.  Pre-operative Diagnosis: acute appendicitis  Post-operative Diagnosis: perforated purulent appendicitis  Surgeon: Gaynelle Adu MD FACS  Assistants: none  Anesthesia: General endotracheal anesthesia  Procedure Details  The patient was seen again in the Holding Room. The risks, benefits, complications, treatment options, and expected outcomes were discussed with the patient and/or family. The possibilities of perforation of viscus, bleeding, recurrent infection, the need for additional procedures, failure to diagnose a condition, and creating a complication requiring transfusion or operation were discussed. There was concurrence with the proposed plan and informed consent was obtained. The site of surgery was properly noted. The patient was taken to Operating Room, identified as Consuello Masse and the procedure verified as Appendectomy. A Time Out was held and the above information confirmed.  The patient was placed in the supine position and general anesthesia was induced, along with placement of orogastric tube, SCDs.  The patient emptied his bladder prior to going to surgery. The abdomen was prepped and draped in a sterile fashion. A 1.5 centimeter infraumbilical incision was made.  The umbilical stalk was elevated, and the midline fascia was incised with a #11 blade.  A Kelly clamp was used to confirm entrance into the peritoneal cavity.  A pursestring suture was passed around the incision with a 0 Vicryl.  A 78mm  Hasson was introduced into the abdomen and the tails of the suture were used to hold the Hasson in place.   The pneumoperitoneum was then established to steady pressure of 15 mmHg.  Additional 5 mm cannulas then placed in the left lower quadrant of the abdomen and the suprapubic region under direct visualization.  There is a fair amount of purulence in the pelvis, right paracolic gutter and above the liver.  The peritoneum was hyperemic and injected.  The distal small bowel was starting to become dilated A careful evaluation of the entire abdomen was carried out. The patient was placed in Trendelenburg and left lateral decubitus position. The small intestines were retracted in the cephalad and left lateral direction away from the pelvis and right lower quadrant. The patient was found to have an severely inflamed and perforated appendix that was folded onto itself and densely adhered to the cecum. There was  evidence of perforation.  The appendix was carefully dissected. The appendix was was skeletonized with the harmonic scalpel.  I was able to identify the confluence of the terminal ileum and cecum.  Given the severe inflammation of the appendix and the base of the appendix I decided to take a small cuff of cecum.  The appendix and a small cuff of cecum was divided at its base using an Ethicon Echelon 60 mm powered stapler with a blue load.  There was about 3 mm of tissue that had not been transected and stapled 7 additional firing was performed.   No appendiceal stump was left in place. The appendix was removed from the abdomen with an Ecco bag through the umbilical port.  There was no evidence of bleeding, leakage, or complication after division of the appendix.  4 L of irrigation was also performed and irrigate suctioned from the abdomen as well.  Local was infiltrated along the right lateral abdominal wall as a tap block and along and around the umbilical fascia  The umbilical port site was closed with  the purse string suture.  I did place an additional interrupted 0 Vicryl using a PMI suture passer the closure was viewed laparoscopically. There was no residual palpable fascial defect.  The trocar site skin wounds were closed with 4-0 Monocryl. Dermabond was applied to the skin incisions.  Instrument, sponge, and needle counts were correct at the conclusion of the case.   Findings: The appendix was found to be inflamed. There were not signs of necrosis.  There was perforation. There was not abscess formation.  There was purulence in the abdomen and the right paracolic gutter, above the liver and in the pelvis .  Estimated Blood Loss:  Minimal         Drains: none         Specimens: appendix         Complications:  None; patient tolerated the procedure well.         Disposition: PACU - hemodynamically stable.         Condition: stable  Mary Sella. Andrey Campanile, MD, FACS General, Bariatric, & Minimally Invasive Surgery [facility], [place]

## 2020-02-21 NOTE — Anesthesia Preprocedure Evaluation (Signed)
Anesthesia Evaluation  Patient identified by MRN, date of birth, ID band Patient awake    Reviewed: Allergy & Precautions, H&P , NPO status , Patient's Chart, lab work & pertinent test results  Airway Mallampati: II   Neck ROM: full    Dental   Pulmonary neg pulmonary ROS,    breath sounds clear to auscultation       Cardiovascular negative cardio ROS   Rhythm:regular Rate:Normal     Neuro/Psych PSYCHIATRIC DISORDERS Anxiety Depression    GI/Hepatic appendicitis   Endo/Other    Renal/GU      Musculoskeletal   Abdominal   Peds  Hematology   Anesthesia Other Findings   Reproductive/Obstetrics                             Anesthesia Physical Anesthesia Plan  ASA: I and emergent  Anesthesia Plan: General   Post-op Pain Management:    Induction: Intravenous  PONV Risk Score and Plan: 2 and Ondansetron, Dexamethasone, Midazolam and Treatment may vary due to age or medical condition  Airway Management Planned: Oral ETT  Additional Equipment:   Intra-op Plan:   Post-operative Plan: Extubation in OR  Informed Consent: I have reviewed the patients History and Physical, chart, labs and discussed the procedure including the risks, benefits and alternatives for the proposed anesthesia with the patient or authorized representative who has indicated his/her understanding and acceptance.     Dental advisory given  Plan Discussed with: CRNA, Anesthesiologist and Surgeon  Anesthesia Plan Comments:         Anesthesia Quick Evaluation

## 2020-02-21 NOTE — Plan of Care (Signed)
Patient arrived on unit from PACU. Alert and oriented but sleepy. Vital signs stable, denies pain and urge to void. Patient settled into room. Assessment initiated. Will continue to monitor according to orders and care plan.

## 2020-02-21 NOTE — Progress Notes (Signed)
[facility] Progress Note  1 Day Post-Op  Subjective: CC-  Abdomen sore but states he is feeling much better since surgery. Denies n/v. Passing some flatus, no BM. Has not gotten OOB since surgery. Has not urinated since surgery.   Objective: Vital signs in last 24 hours: Temp:  [98.8 F (37.1 C)-102.9 F (39.4 C)] 98.8 F (37.1 C) (01/28 0339) Pulse Rate:  [72-95] 77 (01/28 0339) Resp:  [14-21] 18 (01/28 0339) BP: (106-127)/(51-70) 107/60 (01/28 0339) SpO2:  [95 %-100 %] 98 % (01/28 0339) Weight:  [64 kg] 64 kg (01/28 0339)    Intake/Output from previous day: 01/27 0701 - 01/28 0700 In: 2227 [I.V.:1033.7; IV Piggyback:1193.3] Out: 115 [Blood:15] Intake/Output this shift: No intake/output data recorded.  PE: Gen:  Alert, NAD, pleasant HEENT: EOM's intact, pupils equal and round Card:  RRR Pulm:  CTAB, no W/R/R, rate and effort normal Abd: Soft, ND, appropriately tender, +BS, lap incisions C/D/I Skin: warm and dry  Lab Results:  Recent Labs    02/20/20 1719 02/21/20 0733  WBC 6.7 8.4  HGB 17.3* 14.8  HCT 48.9 42.6  PLT 139* PENDING   BMET Recent Labs    02/20/20 1719 02/21/20 0733  NA 136 135  K 3.4* 4.6  CL 97* 100  CO2 22 26  GLUCOSE 122* 158*  BUN 20 20  CREATININE 1.19 1.12  CALCIUM 9.4 8.5*   PT/INR No results for input(s): LABPROT, INR in the last 72 hours. CMP     Component Value Date/Time   NA 135 02/21/2020 0733   K 4.6 02/21/2020 0733   CL 100 02/21/2020 0733   CO2 26 02/21/2020 0733   GLUCOSE 158 (H) 02/21/2020 0733   BUN 20 02/21/2020 0733   CREATININE 1.12 02/21/2020 0733   CALCIUM 8.5 (L) 02/21/2020 0733   PROT 5.4 (L) 02/21/2020 0733   ALBUMIN 3.1 (L) 02/21/2020 0733   AST 20 02/21/2020 0733   ALT 17 02/21/2020 0733   ALKPHOS 42 02/21/2020 0733   BILITOT 1.7 (H) 02/21/2020 0733   GFRNONAA >60 02/21/2020 0733   GFRAA NOT CALCULATED 02/18/2017 0705   Lipase     Component Value Date/Time   LIPASE 20  02/20/2020 1719       Studies/Results: CT ABDOMEN PELVIS W CONTRAST  Result Date: 02/20/2020 CLINICAL DATA:  Abdominal pain for 2 days, fever EXAM: CT ABDOMEN AND PELVIS WITH CONTRAST TECHNIQUE: Multidetector CT imaging of the abdomen and pelvis was performed using the standard protocol following bolus administration of intravenous contrast. CONTRAST:  OMNIPAQUE IOHEXOL 300 MG/ML  SOLN COMPARISON:  None. FINDINGS: Lower chest: No acute pleural or parenchymal lung disease. Hepatobiliary: No focal liver abnormality is seen. No gallstones, gallbladder wall thickening, or biliary dilatation. Pancreas: Unremarkable. No pancreatic ductal dilatation or surrounding inflammatory changes. Spleen: Normal in size without focal abnormality. Adrenals/Urinary Tract: Adrenal glands are unremarkable. Kidneys are normal, without renal calculi, focal lesion, or hydronephrosis. Bladder is unremarkable. Stomach/Bowel: Evaluation of the bowel is limited without oral contrast. No bowel obstruction or ileus. I believe the appendix can be seen in the right lower quadrant adjacent to the iliac vessels, with evidence of distal wall thickening, hyperenhancement, and dilation measuring 11 mm reference image 72/3. No free gas to suggest perforation. No evidence of abscess. Vascular/Lymphatic: No significant vascular findings are present. No enlarged abdominal or pelvic lymph nodes. Reproductive: Prostate is unremarkable. Other: Small amount of free fluid within the rectovesical space. No free intraperitoneal gas. No abdominal wall hernia.  Musculoskeletal: No acute or destructive bony lesions. Reconstructed images demonstrate no additional findings. IMPRESSION: 1. Dilated inflamed appendix in the right lower quadrant, compatible with acute uncomplicated appendicitis. There is free fluid in the pelvis, but no evidence of perforation, loculated fluid, or abscess. 2. No other intra-abdominal or intrapelvic findings on this examination  limited by the lack of oral contrast. Electronically Signed   By: Sharlet Salina M.D.   On: 02/20/2020 20:27    Anti-infectives: Anti-infectives (From admission, onward)   Start     Dose/Rate Route Frequency Ordered Stop   02/21/20 0445  piperacillin-tazobactam (ZOSYN) IVPB 3.375 g        3.375 g 12.5 mL/hr over 240 Minutes Intravenous Every 8 hours 02/21/20 0347     02/20/20 2115  cefTRIAXone (ROCEPHIN) 2 g in sodium chloride 0.9 % 100 mL IVPB  Status:  Discontinued        2 g 200 mL/hr over 30 Minutes Intravenous Every 24 hours 02/20/20 2101 02/21/20 0347   02/20/20 2115  metroNIDAZOLE (FLAGYL) IVPB 500 mg  Status:  Discontinued        500 mg 100 mL/hr over 60 Minutes Intravenous Every 8 hours 02/20/20 2101 02/21/20 0347       Assessment/Plan  Perforated purulent appendicitis S/p laparoscopic appendectomy 1/28 Dr. Andrey Campanile - POD#0 - Clear liquid diet. He is passing flatus so can advance as tolerated to fulls. May develop ileus, monitor closely - Mobilize - If unable to void please bladder scan - High risk for development of abscess due to perforation, plan to continue antibiotics for 7 days  ID - rocephin/flagyl 1/27 x1, zosyn 1/28>> FEN - IVF, CLD VTE - SCDs, lovenox Foley - none Follow up - [facility]   LOS: 0 days    Franne Forts, [facility] 02/21/2020, 9:37 AM Please see Amion for pager number during day hours 7:00am-4:30pm

## 2020-02-21 NOTE — Anesthesia Postprocedure Evaluation (Signed)
Anesthesia Post Note  Patient: Clinton Woods  Procedure(s) Performed: APPENDECTOMY LAPAROSCOPIC (N/A Abdomen)     Patient location during evaluation: PACU Anesthesia Type: General Level of consciousness: awake and alert Pain management: pain level controlled Vital Signs Assessment: post-procedure vital signs reviewed and stable Respiratory status: spontaneous breathing, nonlabored ventilation, respiratory function stable and patient connected to nasal cannula oxygen Cardiovascular status: blood pressure returned to baseline and stable Postop Assessment: no apparent nausea or vomiting Anesthetic complications: no   No complications documented.  Last Vitals:  Vitals:   02/21/20 0330 02/21/20 0339  BP: 114/64 107/60  Pulse: 81 77  Resp: 16 18  Temp: 37.1 C 37.1 C  SpO2: 97% 98%    Last Pain:  Vitals:   02/21/20 0339  TempSrc: Oral  PainSc: Asleep                 Khy Pitre S

## 2020-02-21 NOTE — Discharge Instructions (Signed)
[facility] ° °Please arrive at least 30 min before your appointment to complete your check in paperwork.  If you are unable to arrive 30 min prior to your appointment time we may have to cancel or reschedule you. °LAPAROSCOPIC SURGERY: POST OP INSTRUCTIONS °Always review your discharge instruction sheet given to you by the facility where your surgery was performed. °IF YOU HAVE DISABILITY OR FAMILY LEAVE FORMS, YOU MUST BRING THEM TO THE OFFICE FOR PROCESSING.   °DO NOT GIVE THEM TO YOUR DOCTOR. ° °PAIN CONTROL ° °1. First take acetaminophen (Tylenol) AND/or ibuprofen (Advil) to control your pain after surgery.  Follow directions on package.  Taking acetaminophen (Tylenol) and/or ibuprofen (Advil) regularly after surgery will help to control your pain and lower the amount of prescription pain medication you may need.  You should not take more than 4,000 mg (4 grams) of acetaminophen (Tylenol) in 24 hours.  You should not take ibuprofen (Advil), aleve, motrin, naprosyn or other NSAIDS if you have a history of stomach ulcers or chronic kidney disease.  °2. A prescription for pain medication may be given to you upon discharge.  Take your pain medication as prescribed, if you still have uncontrolled pain after taking acetaminophen (Tylenol) or ibuprofen (Advil). °3. Use ice packs to help control pain. °4. If you need a refill on your pain medication, please contact your pharmacy.  They will contact our office to request authorization. Prescriptions will not be filled after 5pm or on week-ends. ° °HOME MEDICATIONS °5. Take your usually prescribed medications unless otherwise directed. ° °DIET °6. You should follow a light diet the first few days after arrival home.  Be sure to include lots of fluids daily. Avoid fatty, fried foods.  ° °CONSTIPATION °7. It is common to experience some constipation after surgery and if you are taking pain medication.  Increasing fluid intake and taking a stool  softener (such as Colace) will usually help or prevent this problem from occurring.  A mild laxative (Milk of Magnesia or Miralax) should be taken according to package instructions if there are no bowel movements after 48 hours. ° °WOUND/INCISION CARE °8. Most patients will experience some swelling and bruising in the area of the incisions.  Ice packs will help.  Swelling and bruising can take several days to resolve.  °9. Unless discharge instructions indicate otherwise, follow guidelines below  °a. STERI-STRIPS - you may remove your outer bandages 48 hours after surgery, and you may shower at that time.  You have steri-strips (small skin tapes) in place directly over the incision.  These strips should be left on the skin for 7-10 days.   °b. DERMABOND/SKIN GLUE - you may shower in 24 hours.  The glue will flake off over the next 2-3 weeks. °10. Any sutures or staples will be removed at the office during your follow-up visit. ° °ACTIVITIES °11. You may resume regular (light) daily activities beginning the next day--such as daily self-care, walking, climbing stairs--gradually increasing activities as tolerated.  You may have sexual intercourse when it is comfortable.  Refrain from any heavy lifting or straining until approved by your doctor. °a. You may drive when you are no longer taking prescription pain medication, you can comfortably wear a seatbelt, and you can safely maneuver your car and apply brakes. ° °FOLLOW-UP °12. You should see your doctor in the office for a follow-up appointment approximately 2-3 weeks after your surgery.  You should have been given your post-op/follow-up appointment when   your surgery was scheduled.  If you did not receive a post-op/follow-up appointment, make sure that you call for this appointment within a day or two after you arrive home to insure a convenient appointment time. ° °OTHER INSTRUCTIONS ° °WHEN TO CALL YOUR DOCTOR: °1. Fever over 101.0 °2. Inability to  urinate °3. Continued bleeding from incision. °4. Increased pain, redness, or drainage from the incision. °5. Increasing abdominal pain ° °The clinic staff is available to answer your questions during regular business hours.  Please don’t hesitate to call and ask to speak to one of the nurses for clinical concerns.  If you have a medical emergency, go to the nearest emergency room or call 911.  A surgeon from [facility] is always on call at the hospital. °[address], [address], [place], [place]  [address] ? [address] [address], [place], [place]   [address] °[phone] ? [phone] ? FAX [phone] ° ° ° °

## 2020-02-21 NOTE — Anesthesia Procedure Notes (Signed)
Procedure Name: Intubation Date/Time: 02/21/2020 12:29 AM Performed by: Imad Shostak T, CRNA Pre-anesthesia Checklist: Patient identified, Emergency Drugs available, Suction available and Patient being monitored Patient Re-evaluated:Patient Re-evaluated prior to induction Oxygen Delivery Method: Circle system utilized Preoxygenation: Pre-oxygenation with 100% oxygen Induction Type: IV induction and Rapid sequence Ventilation: Mask ventilation without difficulty Laryngoscope Size: Miller and 2 Grade View: Grade I Tube type: Oral Tube size: 7.5 mm Number of attempts: 1 Airway Equipment and Method: Stylet and Oral airway Placement Confirmation: ETT inserted through vocal cords under direct vision,  positive ETCO2 and breath sounds checked- equal and bilateral Secured at: 22 cm Tube secured with: Tape Dental Injury: Teeth and Oropharynx as per pre-operative assessment

## 2020-02-22 LAB — COMPREHENSIVE METABOLIC PANEL
ALT: 14 U/L (ref 0–44)
AST: 17 U/L (ref 15–41)
Albumin: 3 g/dL — ABNORMAL LOW (ref 3.5–5.0)
Alkaline Phosphatase: 35 U/L — ABNORMAL LOW (ref 38–126)
Anion gap: 10 (ref 5–15)
BUN: 22 mg/dL — ABNORMAL HIGH (ref 6–20)
CO2: 23 mmol/L (ref 22–32)
Calcium: 8.5 mg/dL — ABNORMAL LOW (ref 8.9–10.3)
Chloride: 102 mmol/L (ref 98–111)
Creatinine, Ser: 1.07 mg/dL (ref 0.61–1.24)
GFR, Estimated: >60 mL/min (ref >60)
Glucose, Bld: 144 mg/dL — ABNORMAL HIGH (ref 70–99)
Potassium: 4.1 mmol/L (ref 3.5–5.1)
Sodium: 135 mmol/L (ref 135–145)
Total Bilirubin: 1.9 mg/dL — ABNORMAL HIGH (ref 0.3–1.2)
Total Protein: 5.4 g/dL — ABNORMAL LOW (ref 6.5–8.1)

## 2020-02-22 LAB — CBC
HCT: 38 % — ABNORMAL LOW (ref 39.0–52.0)
Hemoglobin: 13.7 g/dL (ref 13.0–17.0)
MCH: 33.2 pg (ref 26.0–34.0)
MCHC: 36.1 g/dL — ABNORMAL HIGH (ref 30.0–36.0)
MCV: 92 fL (ref 80.0–100.0)
Platelets: 96 10*3/uL — ABNORMAL LOW (ref 150–400)
RBC: 4.13 MIL/uL — ABNORMAL LOW (ref 4.22–5.81)
RDW: 12.1 % (ref 11.5–15.5)
WBC: 9.8 10*3/uL (ref 4.0–10.5)
nRBC: 0 % (ref 0.0–0.2)

## 2020-02-22 LAB — MAGNESIUM: Magnesium: 1.9 mg/dL (ref 1.7–2.4)

## 2020-02-22 NOTE — Progress Notes (Signed)
Progress Note: General Surgery Service   Chief Complaint/Subjective: Tolerating liquids, +flatus and BM yesterday, abdominal pain much improved  Objective: Vital signs in last 24 hours: Temp:  [97.6 F (36.4 C)-98.7 F (37.1 C)] 98 F (36.7 C) (01/29 0621) Pulse Rate:  [40-50] 40 (01/29 0621) Resp:  [17-20] 20 (01/29 0621) BP: (98-105)/(46-52) 103/49 (01/29 0621) SpO2:  [100 %] 100 % (01/29 0621)    Intake/Output from previous day: 01/28 0701 - 01/29 0700 In: 1919.2 [I.V.:1766.3; IV Piggyback:152.9] Out: 500 [Urine:500] Intake/Output this shift: No intake/output data recorded.  Gen: NAd  Resp: nonlabored  Card: bradycardic  Abd: soft, ATTP incisions c/d/i  Lab Results: CBC  Recent Labs    02/21/20 0733 02/22/20 0138  WBC 8.4 9.8  HGB 14.8 13.7  HCT 42.6 38.0*  PLT 100* 96*   BMET Recent Labs    02/21/20 0733 02/22/20 0138  NA 135 135  K 4.6 4.1  CL 100 102  CO2 26 23  GLUCOSE 158* 144*  BUN 20 22*  CREATININE 1.12 1.07  CALCIUM 8.5* 8.5*   PT/INR No results for input(s): LABPROT, INR in the last 72 hours. ABG No results for input(s): PHART, HCO3 in the last 72 hours.  Invalid input(s): PCO2, PO2  Anti-infectives: Anti-infectives (From admission, onward)   Start     Dose/Rate Route Frequency Ordered Stop   02/21/20 0445  piperacillin-tazobactam (ZOSYN) IVPB 3.375 g        3.375 g 12.5 mL/hr over 240 Minutes Intravenous Every 8 hours 02/21/20 0347     02/20/20 2115  cefTRIAXone (ROCEPHIN) 2 g in sodium chloride 0.9 % 100 mL IVPB  Status:  Discontinued        2 g 200 mL/hr over 30 Minutes Intravenous Every 24 hours 02/20/20 2101 02/21/20 0347   02/20/20 2115  metroNIDAZOLE (FLAGYL) IVPB 500 mg  Status:  Discontinued        500 mg 100 mL/hr over 60 Minutes Intravenous Every 8 hours 02/20/20 2101 02/21/20 0347      Medications: Scheduled Meds: . acetaminophen  1,000 mg Oral Q8H  . docusate sodium  100 mg Oral BID  . enoxaparin (LOVENOX)  injection  40 mg Subcutaneous Q24H  . ketorolac  30 mg Intravenous Q8H  . pantoprazole (PROTONIX) IV  40 mg Intravenous QHS   Continuous Infusions: . dextrose 5 % and 0.45 % NaCl with KCl 20 mEq/L Stopped (02/22/20 0523)  . piperacillin-tazobactam (ZOSYN)  IV 12.5 mL/hr at 02/22/20 0537   PRN Meds:.diphenhydrAMINE **OR** diphenhydrAMINE, morphine injection, ondansetron **OR** ondansetron (ZOFRAN) IV, oxyCODONE, simethicone  Assessment/Plan: s/p Procedure(s): APPENDECTOMY LAPAROSCOPIC 02/20/2020 -advance diet -ambulate -continue antibiotics -possible home tomorrow    LOS: 1 day   Rodman Pickle, MD [phone] [facility]

## 2020-02-23 MED ORDER — IBUPROFEN 800 MG PO TABS: 800.0000 mg | ORAL_TABLET | Freq: Three times a day (TID) | ORAL | 0 refills | Status: DC | PRN

## 2020-02-23 MED ORDER — AMOXICILLIN-POT CLAVULANATE 875-125 MG PO TABS
1.0000 | ORAL_TABLET | Freq: Two times a day (BID) | ORAL | 0 refills | Status: DC
Start: 2020-02-23 — End: 2020-08-04

## 2020-02-23 NOTE — Progress Notes (Signed)
Nsg Discharge Note  Admit Date:  02/20/2020 Discharge date: 02/23/2020   Clinton Woods to be D/C' AVS completed.  Copy for chart. .  Patient/caregiver able to verbalize understanding.  Discharge Medication: Allergies as of 02/23/2020      Reactions   Doxycycline Hyclate Other (See Comments)   abdominal pain   Sulfamethoxazole-trimethoprim Rash      Medication List    STOP taking these medications   ARIPiprazole 10 MG tablet Commonly known as: ABILIFY   lamoTRIgine 100 MG tablet Commonly known as: LAMICTAL     TAKE these medications   amoxicillin-clavulanate 875-125 MG tablet Commonly known as: Augmentin Take 1 tablet by mouth every 12 (twelve) hours.   ibuprofen 800 MG tablet Commonly known as: ADVIL Take 1 tablet (800 mg total) by mouth every 8 (eight) hours as needed.            Discharge Care Instructions  (From admission, onward)         Start     Ordered   02/23/20 0000  Discharge wound care:       Comments: Ok to shower tomorrow. Glue will likely peel off in 1-3 weeks. No bandage required   02/23/20 0832          Discharge Assessment: Vitals:   02/23/20 0419 02/23/20 0419  BP: (!) 102/58 (!) 102/58  Pulse: 68 (!) 50  Resp: 18 18  Temp: 98.9 F (37.2 C) 98.9 F (37.2 C)  SpO2: 100% 100%   Skin clean, dry and intact without evidence of skin break down, no evidence of skin tears noted. IV catheter discontinued intact. Site without signs and symptoms of complications - no redness or edema noted at insertion site, patient denies c/o pain - only slight tenderness at site.  Dressing with slight pressure applied.  D/c Instructions-Education: Discharge instructions given to patient/family with verbalized understanding. D/c education completed with patient/family including follow up instructions, medication list, d/c activities limitations if indicated, with other d/c instructions as indicated by MD - patient able to verbalize understanding, all  questions fully answered. Patient instructed to return to ED, call 911, or call MD for any changes in condition.  Patient escorted via WC, and D/C home via private auto.  Alexya Mcdaris, Tilford Pillar, RN 02/23/2020 10:13 AM

## 2020-02-23 NOTE — Plan of Care (Signed)
  Problem: Health Behavior/Discharge Planning: Goal: Ability to manage health-related needs will improve Outcome: Completed/Met   Patient discharged to go home self care

## 2020-02-23 NOTE — Discharge Summary (Signed)
Physician Discharge Summary  Clinton Woods IFO:[id number] DOB: [birth year] DOA: 02/20/2020  PCP: Patient, No Pcp Per  Admit date: 02/20/2020 Discharge date: 02/23/2020  Recommendations for Outpatient Follow-up:  1.  (include homehealth, outpatient follow-up instructions, specific recommendations for PCP to follow-up on, etc.)   Follow-up Information    [facility] Go on 03/10/2020.   Specialty: General Surgery Why: Your appointment is 02/15 at 9 am Please arrive 30 minutes prior to your appointment to check in and fill out paperwork. Bring photo ID and insurance information. Contact information: [address] [place] [address] [phone]             Discharge Diagnoses:  Active Problems:   Acute perforated appendicitis   Surgical Procedure: lap appendectomy  Discharge Condition: Good Disposition: Home  Diet recommendation: reg diet   Hospital Course:  20 yo male presented with abdominal pain and was diagnosed with acute appendicitis. He went to the OR for appendectomy, there was noted perforation and purulence. He was admitted for antibiotics. He did well, defervesced and was discharged home POD 2.  Discharge Instructions  Discharge Instructions    Call MD for:  difficulty breathing, headache or visual disturbances   Complete by: As directed    Call MD for:  hives   Complete by: As directed    Call MD for:  persistant nausea and vomiting   Complete by: As directed    Call MD for:  redness, tenderness, or signs of infection (pain, swelling, redness, odor or green/yellow discharge around incision site)   Complete by: As directed    Call MD for:  severe uncontrolled pain   Complete by: As directed    Call MD for:  temperature >100.4   Complete by: As directed    Diet - low sodium heart healthy   Complete by: As directed    Discharge wound care:   Complete by: As directed    Ok to shower tomorrow. Glue will  likely peel off in 1-3 weeks. No bandage required   Driving Restrictions   Complete by: As directed    No driving while on narcotics   Increase activity slowly   Complete by: As directed    Lifting restrictions   Complete by: As directed    No lifting greater than 20 pounds for 3 weeks     Allergies as of 02/23/2020      Reactions   Doxycycline Hyclate Other (See Comments)   abdominal pain   Sulfamethoxazole-trimethoprim Rash      Medication List    STOP taking these medications   ARIPiprazole 10 MG tablet Commonly known as: ABILIFY   lamoTRIgine 100 MG tablet Commonly known as: LAMICTAL     TAKE these medications   amoxicillin-clavulanate 875-125 MG tablet Commonly known as: Augmentin Take 1 tablet by mouth every 12 (twelve) hours.   ibuprofen 800 MG tablet Commonly known as: ADVIL Take 1 tablet (800 mg total) by mouth every 8 (eight) hours as needed.            Discharge Care Instructions  (From admission, onward)         Start     Ordered   02/23/20 0000  Discharge wound care:       Comments: Ok to shower tomorrow. Glue will likely peel off in 1-3 weeks. No bandage required   02/23/20 6629          Follow-up Information    [facility] Go on 03/10/2020.   Specialty: General Surgery Why: Your appointment is 02/15 at 9 am Please arrive 30 minutes prior to your appointment to check in and fill out paperwork. Bring photo ID and insurance information. Contact information: [address] [place] [address] [phone]               The results of significant diagnostics from this hospitalization (including imaging, microbiology, ancillary and laboratory) are listed below for reference.    Significant Diagnostic Studies: CT ABDOMEN PELVIS W CONTRAST  Result Date: 02/20/2020 CLINICAL DATA:  Abdominal pain for 2 days, fever EXAM: CT ABDOMEN AND PELVIS WITH CONTRAST TECHNIQUE: Multidetector CT  imaging of the abdomen and pelvis was performed using the standard protocol following bolus administration of intravenous contrast. CONTRAST:  OMNIPAQUE IOHEXOL 300 MG/ML  SOLN COMPARISON:  None. FINDINGS: Lower chest: No acute pleural or parenchymal lung disease. Hepatobiliary: No focal liver abnormality is seen. No gallstones, gallbladder wall thickening, or biliary dilatation. Pancreas: Unremarkable. No pancreatic ductal dilatation or surrounding inflammatory changes. Spleen: Normal in size without focal abnormality. Adrenals/Urinary Tract: Adrenal glands are unremarkable. Kidneys are normal, without renal calculi, focal lesion, or hydronephrosis. Bladder is unremarkable. Stomach/Bowel: Evaluation of the bowel is limited without oral contrast. No bowel obstruction or ileus. I believe the appendix can be seen in the right lower quadrant adjacent to the iliac vessels, with evidence of distal wall thickening, hyperenhancement, and dilation measuring 11 mm reference image 72/3. No free gas to suggest perforation. No evidence of abscess. Vascular/Lymphatic: No significant vascular findings are present. No enlarged abdominal or pelvic lymph nodes. Reproductive: Prostate is unremarkable. Other: Small amount of free fluid within the rectovesical space. No free intraperitoneal gas. No abdominal wall hernia. Musculoskeletal: No acute or destructive bony lesions. Reconstructed images demonstrate no additional findings. IMPRESSION: 1. Dilated inflamed appendix in the right lower quadrant, compatible with acute uncomplicated appendicitis. There is free fluid in the pelvis, but no evidence of perforation, loculated fluid, or abscess. 2. No other intra-abdominal or intrapelvic findings on this examination limited by the lack of oral contrast. Electronically Signed   By: Sharlet Salina M.D.   On: 02/20/2020 20:27    Labs: Basic Metabolic Panel: Recent Labs  Lab 02/20/20 1719 02/21/20 0733 02/22/20 0138  NA 136  135 135  K 3.4* 4.6 4.1  CL 97* 100 102  CO2 22 26 23   GLUCOSE 122* 158* 144*  BUN 20 20 22*  CREATININE 1.19 1.12 1.07  CALCIUM 9.4 8.5* 8.5*  MG  --  1.8 1.9   Liver Function Tests: Recent Labs  Lab 02/20/20 1719 02/21/20 0733 02/22/20 0138  AST 30 20 17   ALT 23 17 14   ALKPHOS 51 42 35*  BILITOT 2.3* 1.7* 1.9*  PROT 6.8 5.4* 5.4*  ALBUMIN 4.2 3.1* 3.0*    CBC: Recent Labs  Lab 02/20/20 1719 02/21/20 0733 02/22/20 0138  WBC 6.7 8.4 9.8  HGB 17.3* 14.8 13.7  HCT 48.9 42.6 38.0*  MCV 91.4 93.0 92.0  PLT 139* 100* 96*    CBG: No results for input(s): GLUCAP in the last 168 hours.  Active Problems:   Acute perforated appendicitis   Time coordinating discharge: 15 min

## 2020-02-24 LAB — SURGICAL PATHOLOGY

## 2020-08-04 ENCOUNTER — Ambulatory Visit: Payer: Self-pay

## 2020-08-04 ENCOUNTER — Ambulatory Visit (INDEPENDENT_AMBULATORY_CARE_PROVIDER_SITE_OTHER): Payer: BC Managed Care – PPO | Admitting: Family Medicine

## 2020-08-04 ENCOUNTER — Other Ambulatory Visit: Payer: Self-pay

## 2020-08-04 ENCOUNTER — Encounter: Payer: Self-pay | Admitting: Family Medicine

## 2020-08-04 DIAGNOSIS — M25562 Pain in left knee: Secondary | ICD-10-CM | POA: Diagnosis not present

## 2020-08-04 DIAGNOSIS — M25561 Pain in right knee: Secondary | ICD-10-CM

## 2020-08-04 NOTE — Progress Notes (Signed)
   Office Visit Note   Patient: Clinton Woods           Date of Birth: 2000/06/01           MRN: [id number] Visit Date: 08/04/2020 Requested by: No referring provider defined for this encounter. PCP: Patient, No Pcp Per (Inactive)  Subjective: Chief Complaint  Patient presents with   Other    Swollen areas in posterior knees, left more than right, x 2-3 weeks. NKI. Knees feel weak -- after squatting, it feels like the knees will not hold him up as he starts to stand again.    HPI: He is here with left greater than right posterior knee pain.  Symptoms for 2 to 3 weeks, no injury.  He notices a prominence in the back of his knees when he hyperextends them, and they feel somewhat weak when he squats.  He has not had any fluid in the joint, no locking or catching symptoms.  He exercises at home doing some squats, and he jogs for exercise.  He has stopped jogging because of his pain.  He is not taking medication for it.  He is currently working as a Gaffer.              ROS:   All other systems were reviewed and are negative.  Objective: Vital Signs: There were no vitals taken for this visit.  Physical Exam:  General:  Alert and oriented, in no acute distress. Pulm:  Breathing unlabored. Psy:  Normal mood, congruent affect. Skin: No erythema Knees: No effusion in either knee.  No patellofemoral crepitus.  Ligaments are stable, no joint line tenderness.  No popliteal cyst palpable.   Imaging: US Guided Needle Placement  Result Date: 08/04/2020 Limited diagnostic ultrasound of the knees reveals no obvious popliteal cyst in either knee.  No abnormality in the area of concern.   Assessment & Plan: Bilateral posterior knee pain, etiology uncertain. -He will try over-the-counter Advil or Aleve, avoid squats for the next week or 2.  If symptoms persist, he will contact me and I will order MRI scan of his knees.     Procedures: No procedures performed        PMFS  History: Patient Active Problem List   Diagnosis Date Noted   Acute perforated appendicitis 02/21/2020   It band syndrome, left 10/19/2017   It band syndrome, right 10/19/2017   Severe recurrent major depression without psychotic features (HCC) 02/18/2017   Past Medical History:  Diagnosis Date   Anxiety     History reviewed. No pertinent family history.  Past Surgical History:  Procedure Laterality Date   LAPAROSCOPIC APPENDECTOMY N/A 02/20/2020   Procedure: APPENDECTOMY LAPAROSCOPIC;  Surgeon: Gaynelle Adu, MD;  Location: [facility] OR;  Service: General;  Laterality: N/A;   Social History   Occupational History   Not on file  Tobacco Use   Smoking status: Never   Smokeless tobacco: Never  Vaping Use   Vaping Use: Never used  Substance and Sexual Activity   Alcohol use: No   Drug use: No   Sexual activity: Never

## 2020-08-13 ENCOUNTER — Telehealth: Payer: Self-pay

## 2020-08-13 DIAGNOSIS — M25561 Pain in right knee: Secondary | ICD-10-CM

## 2020-08-13 NOTE — Telephone Encounter (Signed)
Pts mother called stating that Clinton Woods has no improvement. They will like to follow through with the MRI.  She also would like to know if we can go ahead and do the back of both of his knees.

## 2020-08-13 NOTE — Telephone Encounter (Signed)
OK to order bilat knee MRIs?

## 2020-08-14 NOTE — Telephone Encounter (Signed)
Orders entered. I called patient and advised.

## 2020-08-14 NOTE — Telephone Encounter (Signed)
Ok to order both

## 2020-08-29 ENCOUNTER — Ambulatory Visit
Admission: RE | Admit: 2020-08-29 | Discharge: 2020-08-29 | Disposition: A | Discharge status: Home / Self Care | Payer: BC Managed Care – PPO | Source: Ambulatory Visit | Attending: Family Medicine | Admitting: Family Medicine

## 2020-08-29 ENCOUNTER — Other Ambulatory Visit: Payer: Self-pay

## 2020-08-29 DIAGNOSIS — M25562 Pain in left knee: Secondary | ICD-10-CM

## 2020-08-29 DIAGNOSIS — M25561 Pain in right knee: Secondary | ICD-10-CM

## 2020-08-31 ENCOUNTER — Telehealth: Payer: Self-pay | Admitting: Family Medicine

## 2020-08-31 NOTE — Telephone Encounter (Signed)
MRI of both knees look good.  No cartilage or ligament damage, nothing to explain his pain.  If the knees still hurt, I can refer him to physical therapy.

## 2020-09-01 NOTE — Telephone Encounter (Signed)
I called and advised Clinton Woods of his results he will think about PT and call us back and let us know what he decides

## 2023-08-23 ENCOUNTER — Other Ambulatory Visit: Payer: Self-pay

## 2023-08-23 DIAGNOSIS — N1831 Chronic kidney disease, stage 3a: Secondary | ICD-10-CM

## 2023-08-24 ENCOUNTER — Inpatient Hospital Stay
Admission: RE | Admit: 2023-08-24 | Discharge: 2023-08-24 | Discharge status: Home / Self Care | Payer: Self-pay | Source: Ambulatory Visit

## 2023-08-24 DIAGNOSIS — N1831 Chronic kidney disease, stage 3a: Secondary | ICD-10-CM
